# Patient Record
Sex: Male | Born: 1995 | Hispanic: Yes | Marital: Single | Smoking: Never smoker
Health system: Southern US, Community
[De-identification: ages and names within clinical notes are randomized; demographics above are authoritative.]

---

## 2015-02-06 ENCOUNTER — Emergency Department
Admission: EM | Admit: 2015-02-06 | Discharge: 2015-02-06 | Disposition: A | Discharge status: Home / Self Care | Payer: No Typology Code available for payment source | Attending: Emergency Medicine | Admitting: Emergency Medicine

## 2015-02-06 ENCOUNTER — Emergency Department: Payer: No Typology Code available for payment source

## 2015-02-06 ENCOUNTER — Encounter: Payer: Self-pay | Admitting: Emergency Medicine

## 2015-02-06 DIAGNOSIS — Y9389 Activity, other specified: Secondary | ICD-10-CM | POA: Diagnosis not present

## 2015-02-06 DIAGNOSIS — S41121A Laceration with foreign body of right upper arm, initial encounter: Secondary | ICD-10-CM | POA: Diagnosis present

## 2015-02-06 DIAGNOSIS — S59901A Unspecified injury of right elbow, initial encounter: Secondary | ICD-10-CM | POA: Diagnosis not present

## 2015-02-06 DIAGNOSIS — Y92410 Unspecified street and highway as the place of occurrence of the external cause: Secondary | ICD-10-CM | POA: Diagnosis not present

## 2015-02-06 DIAGNOSIS — Y998 Other external cause status: Secondary | ICD-10-CM | POA: Insufficient documentation

## 2015-02-06 DIAGNOSIS — S81011A Laceration without foreign body, right knee, initial encounter: Secondary | ICD-10-CM | POA: Diagnosis not present

## 2015-02-06 DIAGNOSIS — T148 Other injury of unspecified body region: Secondary | ICD-10-CM | POA: Diagnosis not present

## 2015-02-06 DIAGNOSIS — S41111A Laceration without foreign body of right upper arm, initial encounter: Secondary | ICD-10-CM

## 2015-02-06 DIAGNOSIS — T07XXXA Unspecified multiple injuries, initial encounter: Secondary | ICD-10-CM

## 2015-02-06 DIAGNOSIS — M7918 Myalgia, other site: Secondary | ICD-10-CM

## 2015-02-06 LAB — BASIC METABOLIC PANEL
Anion gap: 9 (ref 5–15)
BUN: 20 mg/dL (ref 6–20)
CO2: 24 mmol/L (ref 22–32)
Calcium: 9.3 mg/dL (ref 8.9–10.3)
Chloride: 105 mmol/L (ref 101–111)
Creatinine, Ser: 0.78 mg/dL (ref 0.61–1.24)
GFR calc Af Amer: >60 mL/min (ref >60)
GLUCOSE: 92 mg/dL (ref 65–99)
Potassium: 3.7 mmol/L (ref 3.5–5.1)
SODIUM: 138 mmol/L (ref 135–145)

## 2015-02-06 LAB — CBC
HEMATOCRIT: 39 % — AB (ref 40.0–52.0)
Hemoglobin: 13.1 g/dL (ref 13.0–18.0)
MCH: 28.5 pg (ref 26.0–34.0)
MCHC: 33.5 g/dL (ref 32.0–36.0)
MCV: 85.1 fL (ref 80.0–100.0)
PLATELETS: 194 10*3/uL (ref 150–440)
RBC: 4.58 MIL/uL (ref 4.40–5.90)
RDW: 13.6 % (ref 11.5–14.5)
WBC: 7.6 10*3/uL (ref 3.8–10.6)

## 2015-02-06 MED ORDER — BACITRACIN ZINC 500 UNIT/GM EX OINT
TOPICAL_OINTMENT | CUTANEOUS | Status: AC
Start: 2015-02-06 — End: 2015-02-06
  Administered 2015-02-06: 4
  Filled 2015-02-06: qty 3.6

## 2015-02-06 MED ORDER — LIDOCAINE HCL (PF) 1 % IJ SOLN
INTRAMUSCULAR | Status: AC
  Filled 2015-02-06: qty 10

## 2015-02-06 MED ORDER — MORPHINE SULFATE 4 MG/ML IJ SOLN
4.0000 mg | Freq: Once | INTRAMUSCULAR | Status: AC
  Administered 2015-02-06: 4 mg via INTRAVENOUS
  Filled 2015-02-06: qty 1

## 2015-02-06 MED ORDER — AMOXICILLIN-POT CLAVULANATE 875-125 MG PO TABS: 1.0000 | ORAL_TABLET | Freq: Two times a day (BID) | ORAL | Status: AC

## 2015-02-06 MED ORDER — LIDOCAINE HCL 2 % EX GEL
CUTANEOUS | Status: AC
  Filled 2015-02-06: qty 5

## 2015-02-06 MED ORDER — LIDOCAINE HCL (PF) 1 % IJ SOLN
INTRAMUSCULAR | Status: AC
  Administered 2015-02-06: 30 mL via INTRADERMAL
  Filled 2015-02-06: qty 5

## 2015-02-06 MED ORDER — CEFAZOLIN SODIUM 1-5 GM-% IV SOLN
INTRAVENOUS | Status: AC
  Administered 2015-02-06: 1 g via INTRAVENOUS
  Filled 2015-02-06: qty 50

## 2015-02-06 MED ORDER — LIDOCAINE HCL (PF) 1 % IJ SOLN
30.0000 mL | Freq: Once | INTRAMUSCULAR | Status: AC
  Administered 2015-02-06: 30 mL via INTRADERMAL

## 2015-02-06 MED ORDER — OXYCODONE-ACETAMINOPHEN 5-325 MG PO TABS: 1.0000 | ORAL_TABLET | Freq: Four times a day (QID) | ORAL | Status: AC | PRN

## 2015-02-06 MED ORDER — MORPHINE SULFATE 4 MG/ML IJ SOLN
INTRAMUSCULAR | Status: AC
  Administered 2015-02-06: 4 mg via INTRAVENOUS
  Filled 2015-02-06: qty 1

## 2015-02-06 MED ORDER — ONDANSETRON HCL 4 MG/2ML IJ SOLN
4.0000 mg | Freq: Once | INTRAMUSCULAR | Status: AC
  Administered 2015-02-06: 4 mg via INTRAVENOUS
  Filled 2015-02-06: qty 2

## 2015-02-06 MED ORDER — MORPHINE SULFATE 4 MG/ML IJ SOLN
4.0000 mg | Freq: Once | INTRAMUSCULAR | Status: AC
  Administered 2015-02-06: 4 mg via INTRAVENOUS

## 2015-02-06 MED ORDER — CEFAZOLIN SODIUM 1-5 GM-% IV SOLN
1.0000 g | Freq: Once | INTRAVENOUS | Status: AC
  Administered 2015-02-06: 1 g via INTRAVENOUS
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
## 2015-02-06 NOTE — ED Notes (Signed)
Family at bedside. 

## 2015-02-06 NOTE — Discharge Instructions (Signed)
Abrasion °An abrasion is a cut or scrape of the skin. Abrasions do not extend through all layers of the skin and most heal within 10 days. It is important to care for your abrasion properly to prevent infection. °CAUSES  °Most abrasions are caused by falling on, or gliding across, the ground or other surface. When your skin rubs on something, the outer and inner layer of skin rubs off, causing an abrasion. °DIAGNOSIS  °Your caregiver will be able to diagnose an abrasion during a physical exam.  °TREATMENT  °Your treatment depends on how large and deep the abrasion is. Generally, your abrasion will be cleaned with water and a mild soap to remove any dirt or debris. An antibiotic ointment may be put over the abrasion to prevent an infection. A bandage (dressing) may be wrapped around the abrasion to keep it from getting dirty.  °You may need a tetanus shot if: °· You cannot remember when you had your last tetanus shot. °· You have never had a tetanus shot. °· The injury broke your skin. °If you get a tetanus shot, your arm may swell, get red, and feel warm to the touch. This is common and not a problem. If you need a tetanus shot and you choose not to have one, there is a rare chance of getting tetanus. Sickness from tetanus can be serious.  °HOME CARE INSTRUCTIONS  °· If a dressing was applied, change it at least once a day or as directed by your caregiver. If the bandage sticks, soak it off with warm water.   °· Wash the area with water and a mild soap to remove all the ointment 2 times a day. Rinse off the soap and pat the area dry with a clean towel.   °· Reapply any ointment as directed by your caregiver. This will help prevent infection and keep the bandage from sticking. Use gauze over the wound and under the dressing to help keep the bandage from sticking.   °· Change your dressing right away if it becomes wet or dirty.   °· Only take over-the-counter or prescription medicines for pain, discomfort, or fever as  directed by your caregiver.   °· Follow up with your caregiver within 24-48 hours for a wound check, or as directed. If you were not given a wound-check appointment, look closely at your abrasion for redness, swelling, or pus. These are signs of infection. °SEEK IMMEDIATE MEDICAL CARE IF:  °· You have increasing pain in the wound.   °· You have redness, swelling, or tenderness around the wound.   °· You have pus coming from the wound.   °· You have a fever or persistent symptoms for more than 2-3 days. °· You have a fever and your symptoms suddenly get worse. °· You have a bad smell coming from the wound or dressing.   °MAKE SURE YOU:  °· Understand these instructions. °· Will watch your condition. °· Will get help right away if you are not doing well or get worse. °Document Released: 04/01/2005 Document Revised: 06/08/2012 Document Reviewed: 05/26/2011 °[employer]® Patient Information ©2015 [employer]. This information is not intended to replace advice given to you by your health care provider. Make sure you discuss any questions you have with your health care provider. ° °Laceration Care, Adult °A laceration is a cut or lesion that goes through all layers of the skin and into the tissue just beneath the skin. °TREATMENT  °Some lacerations may not require closure. Some lacerations may not be able to be closed due to   an increased risk of infection. It is important to see your caregiver as soon as possible after an injury to minimize the risk of infection and maximize the opportunity for successful closure. °If closure is appropriate, pain medicines may be given, if needed. The wound will be cleaned to help prevent infection. Your caregiver will use stitches (sutures), staples, wound glue (adhesive), or skin adhesive strips to repair the laceration. These tools bring the skin edges together to allow for faster healing and a better cosmetic outcome. However, all wounds will heal with a scar. Once the wound has  healed, scarring can be minimized by covering the wound with sunscreen during the day for 1 full year. °HOME CARE INSTRUCTIONS  °For sutures or staples: °· Keep the wound clean and dry. °· If you were given a bandage (dressing), you should change it at least once a day. Also, change the dressing if it becomes wet or dirty, or as directed by your caregiver. °· Wash the wound with soap and water 2 times a day. Rinse the wound off with water to remove all soap. Pat the wound dry with a clean towel. °· After cleaning, apply a thin layer of the antibiotic ointment as recommended by your caregiver. This will help prevent infection and keep the dressing from sticking. °· You may shower as usual after the first 24 hours. Do not soak the wound in water until the sutures are removed. °· Only take over-the-counter or prescription medicines for pain, discomfort, or fever as directed by your caregiver. °· Get your sutures or staples removed as directed by your caregiver. °For skin adhesive strips: °· Keep the wound clean and dry. °· Do not get the skin adhesive strips wet. You may bathe carefully, using caution to keep the wound dry. °· If the wound gets wet, pat it dry with a clean towel. °· Skin adhesive strips will fall off on their own. You may trim the strips as the wound heals. Do not remove skin adhesive strips that are still stuck to the wound. They will fall off in time. °For wound adhesive: °· You may briefly wet your wound in the shower or bath. Do not soak or scrub the wound. Do not swim. Avoid periods of heavy perspiration until the skin adhesive has fallen off on its own. After showering or bathing, gently pat the wound dry with a clean towel. °· Do not apply liquid medicine, cream medicine, or ointment medicine to your wound while the skin adhesive is in place. This may loosen the film before your wound is healed. °· If a dressing is placed over the wound, be careful not to apply tape directly over the skin  adhesive. This may cause the adhesive to be pulled off before the wound is healed. °· Avoid prolonged exposure to sunlight or tanning lamps while the skin adhesive is in place. Exposure to ultraviolet light in the first year will darken the scar. °· The skin adhesive will usually remain in place for 5 to 10 days, then naturally fall off the skin. Do not pick at the adhesive film. °You may need a tetanus shot if: °· You cannot remember when you had your last tetanus shot. °· You have never had a tetanus shot. °If you get a tetanus shot, your arm may swell, get red, and feel warm to the touch. This is common and not a problem. If you need a tetanus shot and you choose not to have one, there is a rare chance   of getting tetanus. Sickness from tetanus can be serious. SEEK MEDICAL CARE IF:   You have redness, swelling, or increasing pain in the wound.  You see a red line that goes away from the wound.  You have yellowish-white fluid (pus) coming from the wound.  You have a fever.  You notice a bad smell coming from the wound or dressing.  Your wound breaks open before or after sutures have been removed.  You notice something coming out of the wound such as wood or glass.  Your wound is on your hand or foot and you cannot move a finger or toe. SEEK IMMEDIATE MEDICAL CARE IF:   Your pain is not controlled with prescribed medicine.  You have severe swelling around the wound causing pain and numbness or a change in color in your arm, hand, leg, or foot.  Your wound splits open and starts bleeding.  You have worsening numbness, weakness, or loss of function of any joint around or beyond the wound.  You develop painful lumps near the wound or on the skin anywhere on your body. MAKE SURE YOU:   Understand these instructions.  Will watch your condition.  Will get help right away if you are not doing well or get worse. Document Released: 06/22/2005 Document Revised: 09/14/2011 Document Reviewed:  12/16/2010 [facility] Patient Information 2015 [place], [place]. This information is not intended to replace advice given to you by your health care provider. Make sure you discuss any questions you have with your health care provider.  Motor Vehicle Collision It is common to have multiple bruises and sore muscles after a motor vehicle collision (MVC). These tend to feel worse for the first 24 hours. You may have the most stiffness and soreness over the first several hours. You may also feel worse when you wake up the first morning after your collision. After this point, you will usually begin to improve with each day. The speed of improvement often depends on the severity of the collision, the number of injuries, and the location and nature of these injuries. HOME CARE INSTRUCTIONS  Put ice on the injured area.  Put ice in a plastic bag.  Place a towel between your skin and the bag.  Leave the ice on for 15-20 minutes, 3-4 times a day, or as directed by your health care provider.  Drink enough fluids to keep your urine clear or pale yellow. Do not drink alcohol.  Take a warm shower or bath once or twice a day. This will increase blood flow to sore muscles.  You may return to activities as directed by your caregiver. Be careful when lifting, as this may aggravate neck or back pain.  Only take over-the-counter or prescription medicines for pain, discomfort, or fever as directed by your caregiver. Do not use aspirin. This may increase bruising and bleeding. SEEK IMMEDIATE MEDICAL CARE IF:  You have numbness, tingling, or weakness in the arms or legs.  You develop severe headaches not relieved with medicine.  You have severe neck pain, especially tenderness in the middle of the back of your neck.  You have changes in bowel or bladder control.  There is increasing pain in any area of the body.  You have shortness of breath, light-headedness, dizziness, or fainting.  You have chest  pain.  You feel sick to your stomach (nauseous), throw up (vomit), or sweat.  You have increasing abdominal discomfort.  There is blood in your urine, stool, or vomit.  You have pain  in your shoulder (shoulder strap areas).  You feel your symptoms are getting worse. MAKE SURE YOU:  Understand these instructions.  Will watch your condition.  Will get help right away if you are not doing well or get worse. Document Released: 06/22/2005 Document Revised: 11/06/2013 Document Reviewed: 11/19/2010 [facility] Patient Information 2015 [place], [place]. This information is not intended to replace advice given to you by your health care provider. Make sure you discuss any questions you have with your health care provider.  Musculoskeletal Pain Musculoskeletal pain is muscle and boney aches and pains. These pains can occur in any part of the body. Your caregiver may treat you without knowing the cause of the pain. They may treat you if blood or urine tests, X-rays, and other tests were normal.  CAUSES There is often not a definite cause or reason for these pains. These pains may be caused by a type of germ (virus). The discomfort may also come from overuse. Overuse includes working out too hard when your body is not fit. Boney aches also come from weather changes. Bone is sensitive to atmospheric pressure changes. HOME CARE INSTRUCTIONS   Ask when your test results will be ready. Make sure you get your test results.  Only take over-the-counter or prescription medicines for pain, discomfort, or fever as directed by your caregiver. If you were given medications for your condition, do not drive, operate machinery or power tools, or sign legal documents for 24 hours. Do not drink alcohol. Do not take sleeping pills or other medications that may interfere with treatment.  Continue all activities unless the activities cause more pain. When the pain lessens, slowly resume normal activities. Gradually  increase the intensity and duration of the activities or exercise.  During periods of severe pain, bed rest may be helpful. Lay or sit in any position that is comfortable.  Putting ice on the injured area.  Put ice in a bag.  Place a towel between your skin and the bag.  Leave the ice on for 15 to 20 minutes, 3 to 4 times a day.  Follow up with your caregiver for continued problems and no reason can be found for the pain. If the pain becomes worse or does not go away, it may be necessary to repeat tests or do additional testing. Your caregiver may need to look further for a possible cause. SEEK IMMEDIATE MEDICAL CARE IF:  You have pain that is getting worse and is not relieved by medications.  You develop chest pain that is associated with shortness or breath, sweating, feeling sick to your stomach (nauseous), or throw up (vomit).  Your pain becomes localized to the abdomen.  You develop any new symptoms that seem different or that concern you. MAKE SURE YOU:   Understand these instructions.  Will watch your condition.  Will get help right away if you are not doing well or get worse. Document Released: 06/22/2005 Document Revised: 09/14/2011 Document Reviewed: 02/24/2013 [facility] Patient Information 2015 [place], [place]. This information is not intended to replace advice given to you by your health care provider. Make sure you discuss any questions you have with your health care provider.

## 2015-02-06 NOTE — ED Notes (Signed)
Ointment and dressing applied to rt elbow, and rt knee.
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
## 2017-12-04 DEATH — deceased
